# Patient Record
Sex: Female | Born: 2005 | Race: White | Hispanic: No | Marital: Single | Smoking: Never smoker
Health system: Southern US, Community
[De-identification: ages and names within clinical notes are randomized; demographics above are authoritative.]

## PROBLEM LIST (undated history)

## (undated) DIAGNOSIS — N946 Dysmenorrhea, unspecified: Secondary | ICD-10-CM

## (undated) DIAGNOSIS — N920 Excessive and frequent menstruation with regular cycle: Secondary | ICD-10-CM

## (undated) DIAGNOSIS — R5383 Other fatigue: Secondary | ICD-10-CM

## (undated) DIAGNOSIS — F419 Anxiety disorder, unspecified: Secondary | ICD-10-CM

## (undated) DIAGNOSIS — E739 Lactose intolerance, unspecified: Secondary | ICD-10-CM

## (undated) DIAGNOSIS — R109 Unspecified abdominal pain: Secondary | ICD-10-CM

## (undated) DIAGNOSIS — G540 Brachial plexus disorders: Secondary | ICD-10-CM

## (undated) DIAGNOSIS — Q742 Other congenital malformations of lower limb(s), including pelvic girdle: Secondary | ICD-10-CM

## (undated) DIAGNOSIS — F32A Depression, unspecified: Secondary | ICD-10-CM

## (undated) HISTORY — DX: Anxiety disorder, unspecified: F41.9

## (undated) HISTORY — DX: Dysmenorrhea, unspecified: N94.6

## (undated) HISTORY — PX: OTHER SURGICAL HISTORY: SHX169

## (undated) HISTORY — DX: Brachial plexus disorders: G54.0

## (undated) HISTORY — DX: Excessive and frequent menstruation with regular cycle: N92.0

## (undated) HISTORY — DX: Other fatigue: R53.83

## (undated) HISTORY — DX: Lactose intolerance, unspecified: E73.9

## (undated) HISTORY — DX: Depression, unspecified: F32.A

## (undated) HISTORY — PX: TONSILLECTOMY: SUR1361

## (undated) HISTORY — DX: Other congenital malformations of lower limb(s), including pelvic girdle: Q74.2

## (undated) HISTORY — DX: Unspecified abdominal pain: R10.9

---
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
## 2024-01-03 DIAGNOSIS — F331 Major depressive disorder, recurrent, moderate: Secondary | ICD-10-CM | POA: Diagnosis not present

## 2024-01-31 DIAGNOSIS — F331 Major depressive disorder, recurrent, moderate: Secondary | ICD-10-CM | POA: Diagnosis not present

## 2024-02-14 DIAGNOSIS — F331 Major depressive disorder, recurrent, moderate: Secondary | ICD-10-CM | POA: Diagnosis not present

## 2024-02-28 DIAGNOSIS — F331 Major depressive disorder, recurrent, moderate: Secondary | ICD-10-CM | POA: Diagnosis not present

## 2024-03-13 DIAGNOSIS — F331 Major depressive disorder, recurrent, moderate: Secondary | ICD-10-CM | POA: Diagnosis not present

## 2024-03-19 DIAGNOSIS — L03031 Cellulitis of right toe: Secondary | ICD-10-CM | POA: Diagnosis not present

## 2024-03-27 DIAGNOSIS — F331 Major depressive disorder, recurrent, moderate: Secondary | ICD-10-CM | POA: Diagnosis not present

## 2024-04-10 DIAGNOSIS — F331 Major depressive disorder, recurrent, moderate: Secondary | ICD-10-CM | POA: Diagnosis not present

## 2024-05-04 DIAGNOSIS — F331 Major depressive disorder, recurrent, moderate: Secondary | ICD-10-CM | POA: Diagnosis not present
